# Patient Record
Sex: Female | Born: 1995 | Hispanic: Yes | Marital: Single | Smoking: Never smoker
Health system: Southern US, Community
[De-identification: ages and names within clinical notes are randomized; demographics above are authoritative.]

## PROBLEM LIST (undated history)

## (undated) DIAGNOSIS — J039 Acute tonsillitis, unspecified: Secondary | ICD-10-CM

---
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
## 2015-06-14 ENCOUNTER — Telehealth: Payer: Self-pay

## 2015-06-14 NOTE — Telephone Encounter (Signed)
Patient calling about a blood abnormality that she noticed on her lab result sheet- RDW.  Writer explained that her were slightly elevated which may be her normal.  Patient is taking monthly B12 injections and was worried that she may have to double them.  Per Dr. Darnelle CatalanMagrinat she does not need a second monthly injections. Writer also went over her echo results per Dr. Darnelle CatalanMagrinat who states that her echo was normal showing her heart to be very strong but a bit "stiff."  Writer encouraged patient to call Dr. Rene PaciHussein with any further cardiology issues. Patient was content with the above discussion, was very thankful for the information and stated understanding.  Patient encouraged to call with any further questions.

## 2016-08-16 IMAGING — CT CT NECK W/ CM
4 series · 17 of 33 positions shown, 20 images · IV contrast (Iodine)
Comparison: None.

CLINICAL DATA: LEFT neck pain and swelling, sore throat, recent
diagnosis of tonsillitis.

EXAM:
CT NECK WITH CONTRAST
TECHNIQUE: Multidetector CT imaging of the neck was performed using the
standard protocol following the bolus administration of intravenous
contrast.
CONTRAST:  75mL OMNIPAQUE IOHEXOL 300 MG/ML  SOLN

[Series 201: soft tissue, idose (2) · axial · 0.50mm/px · z∈[+43,+195]mm · 7 of 102 slices shown, 9 images (1 of 2)]
[im 13/102  soft-tissue]
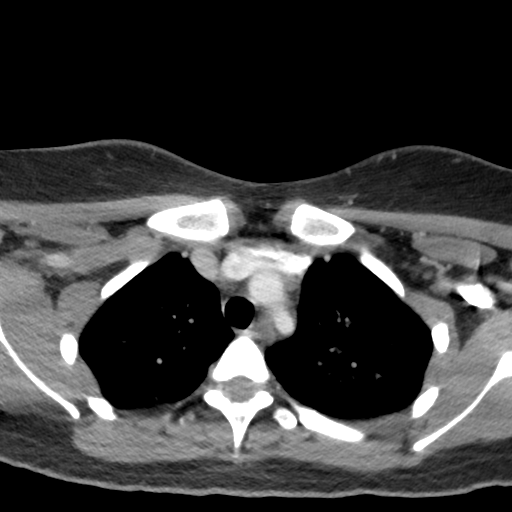
[im 13/102  bone]
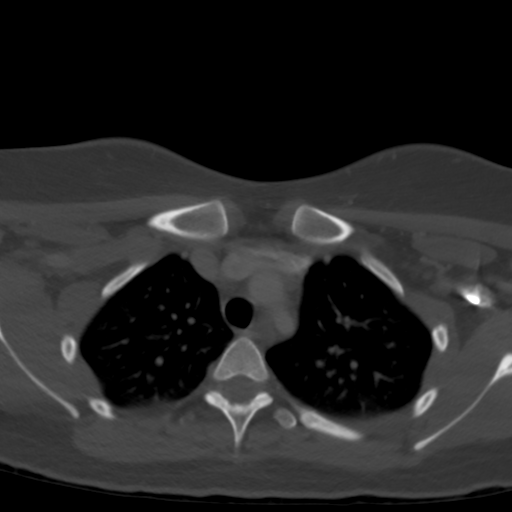
[im 26/102  bone]
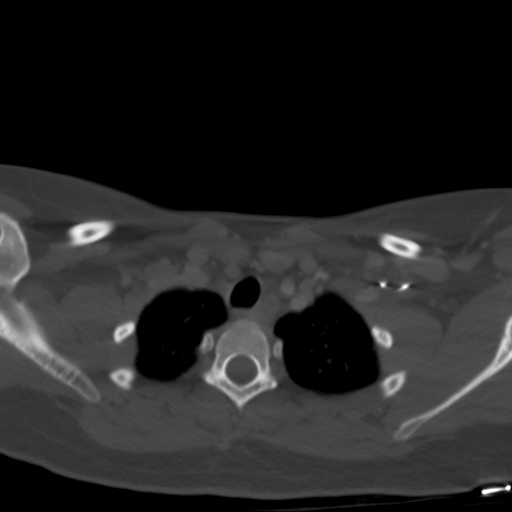
[im 38/102  bone]
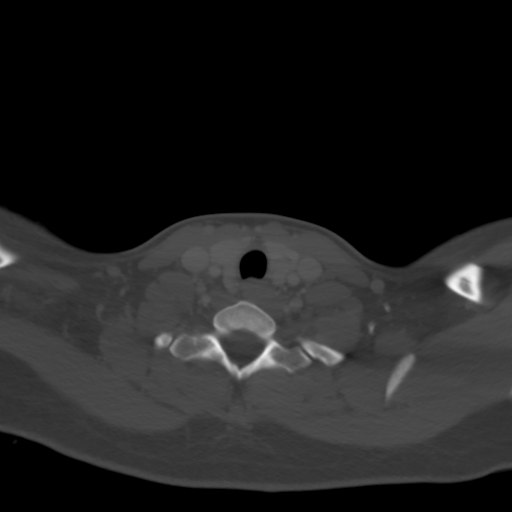
[im 51/102  bone]
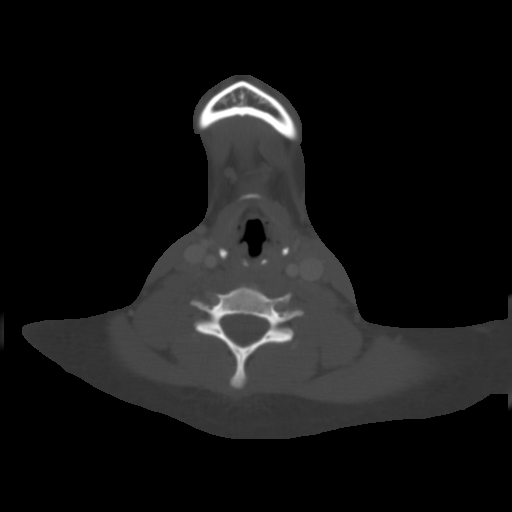
[im 64/102  soft-tissue]
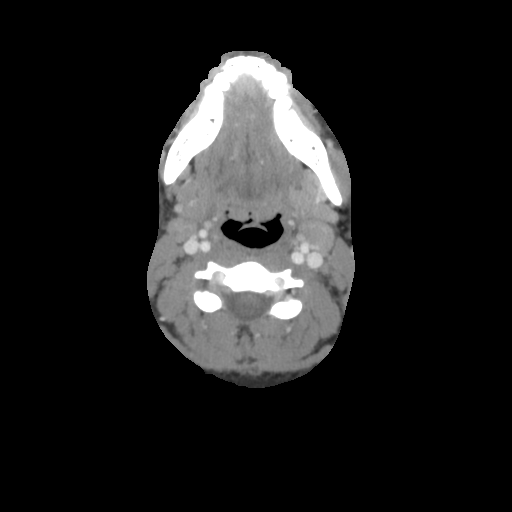
[im 64/102  bone]
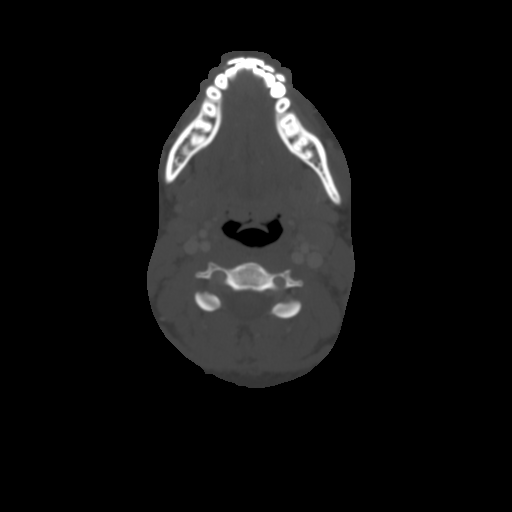
[im 76/102  bone]
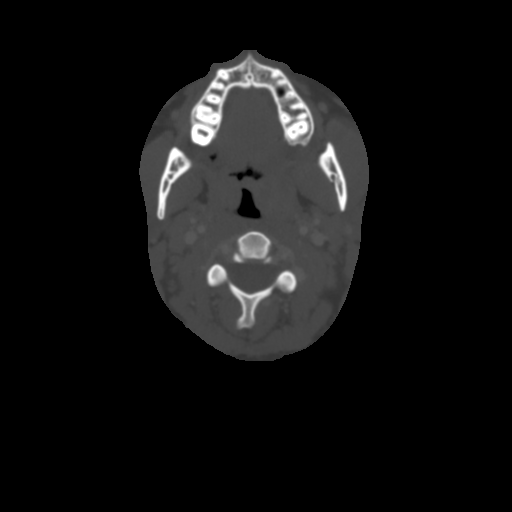
[im 89/102  bone]
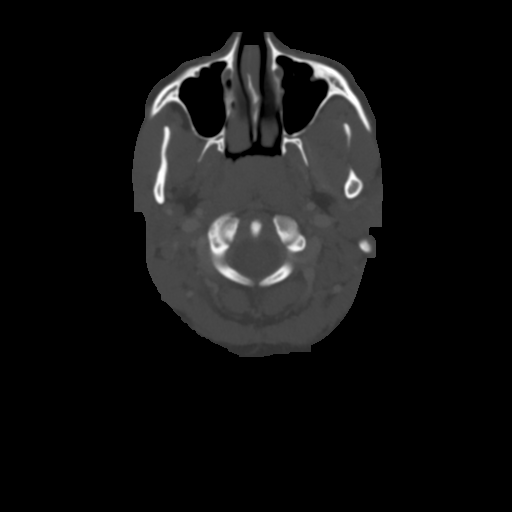

[Series 203: coronal, idose (2) · coronal · 0.45mm/px · 3 of 107 slices shown]
[im 22/107  bone]
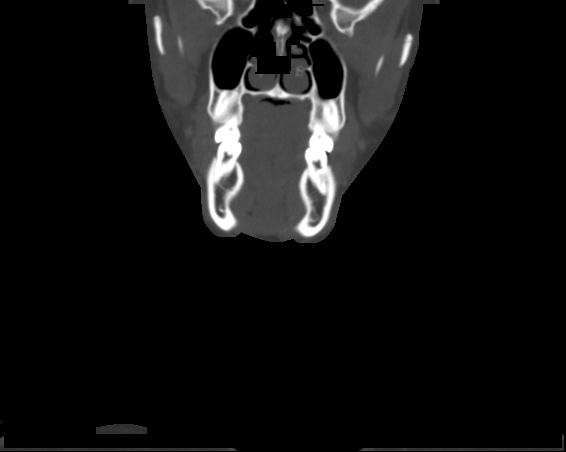
[im 43/107  bone]
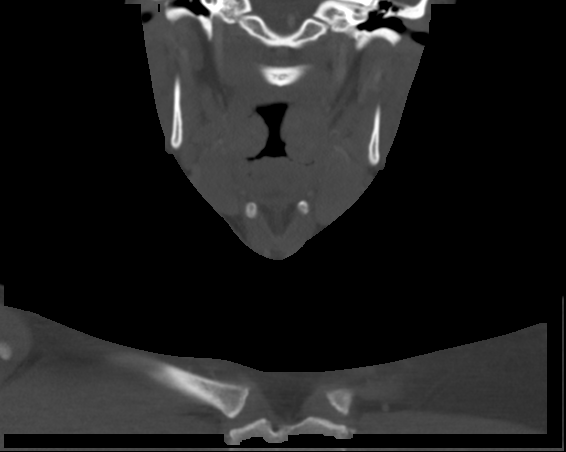
[im 64/107  bone]
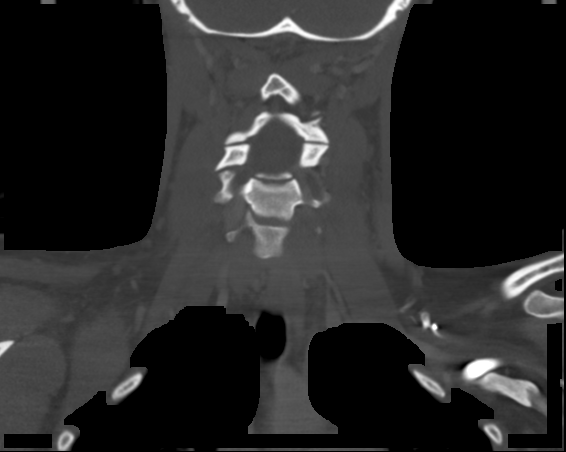

[Series 204: sagittal, idose (2) · sagittal · 0.45mm/px · 5 of 78 slices shown, 6 images]
[im 26/78  bone]
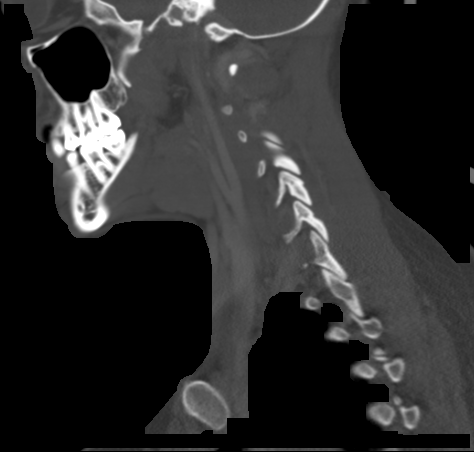
[im 33/78  bone]
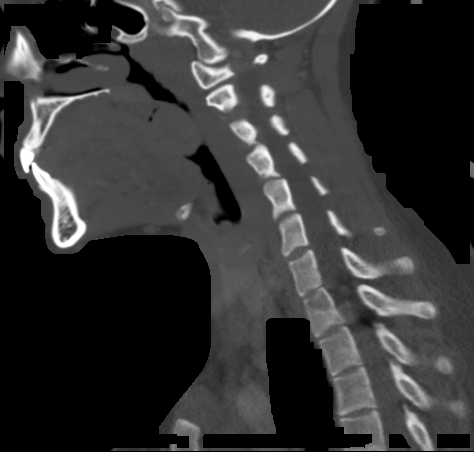
[im 39/78  soft-tissue]
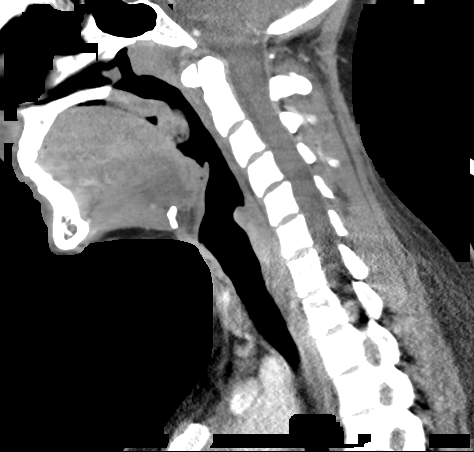
[im 39/78  bone]
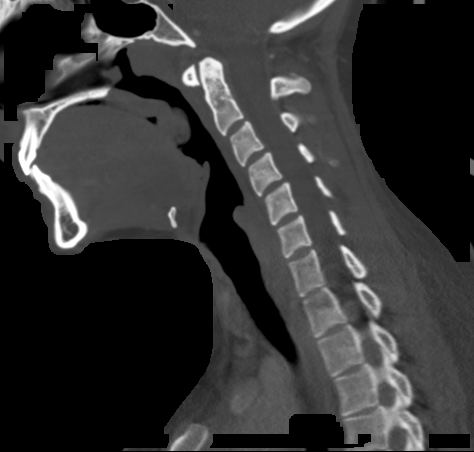
[im 45/78  bone]
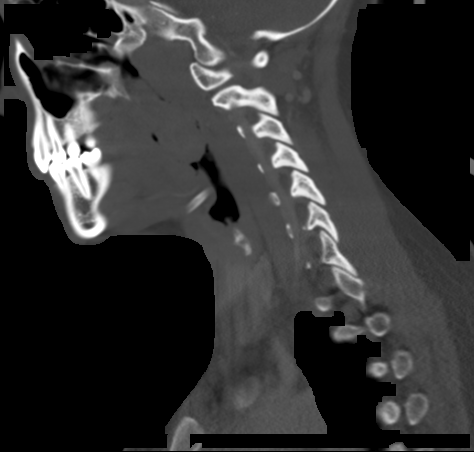
[im 52/78  bone]
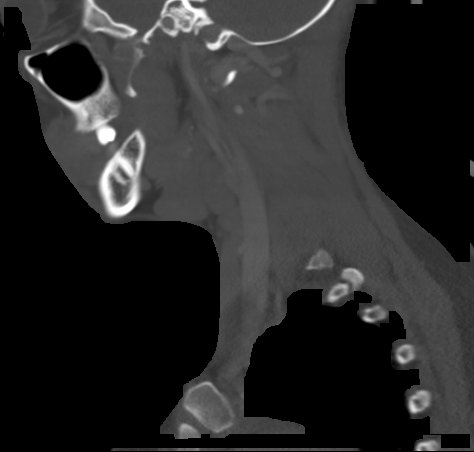

[Series 207: soft tissue, idose (2) · axial · 0.61mm/px · z∈[+43,+69]mm · 2 of 51 slices shown (2 of 2)]
[im 13/51  soft-tissue]
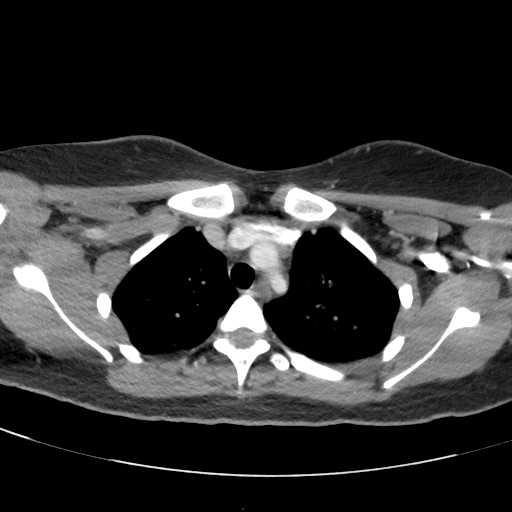
[im 26/51  soft-tissue]
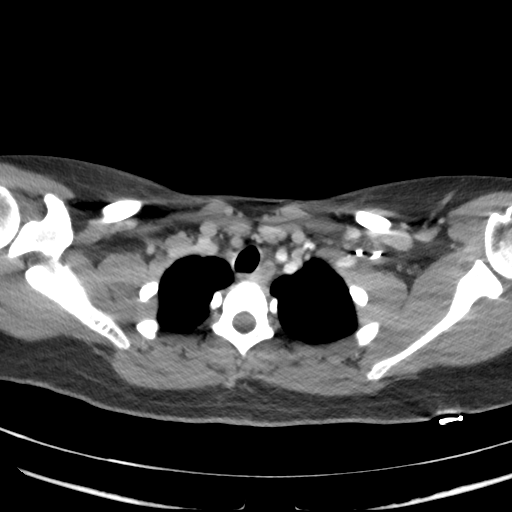

[17 of 33 positions shown; findings below may reference images not displayed]

FINDINGS: Pharynx and larynx: Enlarged LEFT greater than RIGHT palatine
tonsils with striations. Preservation of parapharyngeal fat tissue
planes. No abscess. Airway is patent. Normal appearance of the
larynx.

Salivary glands: Normal.

Thyroid: 5 mm RIGHT thyroid nodule, below size followup
recommendations, otherwise unremarkable.

Lymph nodes: 13 mm short access bilateral level IIa reniform lymph
nodes. Additional smaller lymph nodes in the neck.

Vascular: Normal.

Limited intracranial: Normal.

Visualized orbits: Normal.

Mastoids and visualized paranasal sinuses: Well aerated.

Skeleton: Normal.

Upper chest: Well aerated.
IMPRESSION: Acute LEFT greater than RIGHT tonsillitis without peritonsillar
abscess. Patent airway.

Lymphadenopathy is likely reactive.
